# Patient Record
Sex: Female | Born: 1950 | Race: White | Hispanic: No | Marital: Married | Smoking: Former smoker
Health system: Southern US, Community
[De-identification: ages and names within clinical notes are randomized; demographics above are authoritative.]

## PROBLEM LIST (undated history)

## (undated) DIAGNOSIS — R5383 Other fatigue: Secondary | ICD-10-CM

## (undated) DIAGNOSIS — E039 Hypothyroidism, unspecified: Secondary | ICD-10-CM

## (undated) DIAGNOSIS — M545 Low back pain, unspecified: Secondary | ICD-10-CM

## (undated) DIAGNOSIS — F32A Depression, unspecified: Secondary | ICD-10-CM

## (undated) DIAGNOSIS — M533 Sacrococcygeal disorders, not elsewhere classified: Secondary | ICD-10-CM

## (undated) DIAGNOSIS — F329 Major depressive disorder, single episode, unspecified: Secondary | ICD-10-CM

## (undated) HISTORY — DX: Low back pain, unspecified: M54.50

## (undated) HISTORY — PX: TONSILLECTOMY: SUR1361

## (undated) HISTORY — PX: WRIST SURGERY: SHX841

## (undated) HISTORY — DX: Sacrococcygeal disorders, not elsewhere classified: M53.3

## (undated) HISTORY — DX: Other fatigue: R53.83

## (undated) HISTORY — PX: COLON RESECTION SIGMOID: SHX6737

## (undated) HISTORY — DX: Major depressive disorder, single episode, unspecified: F32.9

## (undated) HISTORY — PX: RADICAL HYSTERECTOMY: SHX2283

## (undated) HISTORY — DX: Low back pain: M54.5

## (undated) HISTORY — PX: ABDOMINAL WALL MESH  REMOVAL: SHX1116

## (undated) HISTORY — DX: Depression, unspecified: F32.A

## (undated) HISTORY — PX: CHOLECYSTECTOMY: SHX55

## (undated) HISTORY — DX: Hypothyroidism, unspecified: E03.9

---

## 2011-04-26 DIAGNOSIS — R32 Unspecified urinary incontinence: Secondary | ICD-10-CM | POA: Insufficient documentation

## 2011-05-27 DIAGNOSIS — E039 Hypothyroidism, unspecified: Secondary | ICD-10-CM | POA: Insufficient documentation

## 2012-03-14 ENCOUNTER — Encounter (INDEPENDENT_AMBULATORY_CARE_PROVIDER_SITE_OTHER): Payer: Self-pay | Admitting: General Surgery

## 2012-04-27 ENCOUNTER — Ambulatory Visit (INDEPENDENT_AMBULATORY_CARE_PROVIDER_SITE_OTHER): Payer: BC Managed Care – PPO | Admitting: Surgery

## 2012-05-23 DIAGNOSIS — M79673 Pain in unspecified foot: Secondary | ICD-10-CM | POA: Insufficient documentation

## 2012-05-23 DIAGNOSIS — M1710 Unilateral primary osteoarthritis, unspecified knee: Secondary | ICD-10-CM | POA: Insufficient documentation

## 2012-05-23 DIAGNOSIS — M7580 Other shoulder lesions, unspecified shoulder: Secondary | ICD-10-CM | POA: Insufficient documentation

## 2013-05-14 DIAGNOSIS — I10 Essential (primary) hypertension: Secondary | ICD-10-CM | POA: Insufficient documentation

## 2015-02-13 DIAGNOSIS — H02831 Dermatochalasis of right upper eyelid: Secondary | ICD-10-CM | POA: Insufficient documentation

## 2015-02-13 DIAGNOSIS — H02834 Dermatochalasis of left upper eyelid: Secondary | ICD-10-CM

## 2016-09-30 DIAGNOSIS — M23203 Derangement of unspecified medial meniscus due to old tear or injury, right knee: Secondary | ICD-10-CM | POA: Insufficient documentation

## 2017-08-23 ENCOUNTER — Ambulatory Visit: Payer: Self-pay | Admitting: Allergy and Immunology

## 2017-09-06 ENCOUNTER — Encounter: Payer: Self-pay | Admitting: Pediatrics

## 2017-09-06 ENCOUNTER — Ambulatory Visit: Payer: Medicare HMO | Admitting: Pediatrics

## 2017-09-06 VITALS — BP 148/76 | HR 70 | Temp 97.8°F | Resp 16 | Ht 63.2 in | Wt 181.2 lb

## 2017-09-06 DIAGNOSIS — J45909 Unspecified asthma, uncomplicated: Secondary | ICD-10-CM | POA: Insufficient documentation

## 2017-09-06 DIAGNOSIS — J3089 Other allergic rhinitis: Secondary | ICD-10-CM

## 2017-09-06 DIAGNOSIS — J4530 Mild persistent asthma, uncomplicated: Secondary | ICD-10-CM

## 2017-09-06 DIAGNOSIS — K219 Gastro-esophageal reflux disease without esophagitis: Secondary | ICD-10-CM

## 2017-09-06 MED ORDER — LEVALBUTEROL TARTRATE 45 MCG/ACT IN AERO: INHALATION_SPRAY | RESPIRATORY_TRACT | 1 refills | Status: AC

## 2017-09-06 MED ORDER — FLUTICASONE PROPIONATE 50 MCG/ACT NA SUSP: NASAL | 5 refills | Status: AC

## 2017-09-06 MED ORDER — METHYLPREDNISOLONE ACETATE 40 MG/ML IJ SUSP
40.0000 mg | Freq: Once | INTRAMUSCULAR | Status: AC
  Administered 2017-09-06: 40 mg via INTRAMUSCULAR

## 2017-09-06 MED ORDER — OMEPRAZOLE 20 MG PO CPDR: DELAYED_RELEASE_CAPSULE | ORAL | 5 refills | Status: DC

## 2017-09-06 MED ORDER — MONTELUKAST SODIUM 10 MG PO TABS: ORAL_TABLET | ORAL | 5 refills | Status: DC

## 2017-09-06 NOTE — Progress Notes (Signed)
[address] [place] [address] Dept: [phone]  New Patient Note  Patient ID: Zoe Willis, female    DOB: [birth year]  Age: 67 y.o. MRN: [id number] Date of Office Visit: 09/06/2017 Referring provider: Charmayne Sheer, MD [address] [place], [place] [address]    Chief Complaint: Nasal Congestion (sinus drainage since 04/2017.) and Cough (propane leak in home.  caused cough and alot of nasal sx.)  HPI Zoe Willis presents for evaluation of sinus problems since November 2018. She has aggravation of her nasal symptoms on exposure to dust. During this time she has had shortness of breath which improved with an albuterol inhaler. However if she  used the inhaler , she would developed a headache and heartburn.. She improved with an injection of a steroid. She cannot take prednisone tablets because of damage to her stomach and intestines when a mesh to repair a  hernia perforated her abdominal wall In November . 2018 she had an exposure to a central gas leak. They then removed central gas heating and replaced  the unit with a heat pump. A heat pump was not installed properly and they have  had a great deal of dust into the house.Further repairs are expected. She is having heartburn. She does not have a history of asthma in the past. She has never had eczema  Review of Systems  Constitutional: Negative.   HENT:       Sinus problems since November 2018 aggravated by exposure to dust Tonsillectomy  Eyes: Negative.   Respiratory:       Wheezing and shortness of breath since November 2018. Relieved with an albuterol inhaler  Cardiovascular:       Slight elevation of her blood pressure since November 2018  Gastrointestinal:       Acid reflux since November 2018. Cholecystectomy, appendectomy and a hernia mesh which had to be removed.  Genitourinary:       Hysterectomy  Musculoskeletal: Negative.   Skin: Negative.   Neurological:       History of migraine headaches    Endo/Heme/Allergies:       No diabetes. Low thyroid function  Psychiatric/Behavioral: Negative.     Outpatient Encounter Medications as of 09/06/2017  Medication Sig  . acetaminophen (TYLENOL) 500 MG tablet Take 500 mg by mouth every 6 (six) hours as needed for mild pain or headache.  . fluticasone (FLONASE) 50 MCG/ACT nasal spray One spray each nostril twice a day for nasal congestion or drainage.  Marland Kitchen levothyroxine (SYNTHROID, LEVOTHROID) 88 MCG tablet Take 88 mcg by mouth daily.  Marland Kitchen LORazepam (ATIVAN) 0.5 MG tablet Take 0.5 mg by mouth every 8 (eight) hours.  . Olopatadine HCl 0.2 % SOLN Place one drop into both eyes daily.  Marland Kitchen omeprazole (PRILOSEC) 20 MG capsule One capsule once a day for reflux  . [DISCONTINUED] fluticasone (FLONASE) 50 MCG/ACT nasal spray SPRAY 1 SPRAY INTO EACH NOSTRIL EVERY DAY  . [DISCONTINUED] omeprazole (PRILOSEC) 20 MG capsule Take 20 mg by mouth daily.  Marland Kitchen levalbuterol (XOPENEX HFA) 45 MCG/ACT inhaler 2 puffs with spacer every 6 hours if needed for cough or wheeze.  . montelukast (SINGULAIR) 10 MG tablet Take 1 tablet once a day for coughing or wheezing  . [DISCONTINUED] buPROPion (ZYBAN) 150 MG 12 hr tablet Take 150 mg by mouth 2 (two) times daily.  . [DISCONTINUED] cholecalciferol (VITAMIN D) 400 UNITS TABS Take by mouth.  . [DISCONTINUED] ciprofloxacin (CIPRO) 500 MG tablet Take 500 mg by mouth 2 (  two) times daily.  . [DISCONTINUED] estrogens, conjugated, (PREMARIN) 0.3 MG tablet Take 0.3 mg by mouth daily. Take daily for 21 days then do not take for 7 days.  . [DISCONTINUED] meloxicam (MOBIC) 7.5 MG tablet Take 7.5 mg by mouth daily.  . [DISCONTINUED] oxyCODONE-acetaminophen (PERCOCET/ROXICET) 5-325 MG per tablet Take 1 tablet by mouth every 4 (four) hours as needed.  . [DISCONTINUED] PREVNAR 13 SUSP injection TO BE ADMINISTERED BY PHARMACIST FOR IMMUNIZATION  . [DISCONTINUED] psyllium (METAMUCIL) 58.6 % powder Take 1 packet by mouth 3 (three) times daily.  .  [EXPIRED] methylPREDNISolone acetate (DEPO-MEDROL) injection 40 mg    No facility-administered encounter medications on file as of 09/06/2017.      Drug Allergies:  Allergies  Allergen Reactions  . Iodine Diarrhea, Itching and Hives    Tongue swells also Tongue swells also   . Doxycycline Swelling  . Shellfish Allergy Other (See Comments)  . Sucralfate Other (See Comments)    Unknown  . Kinevac [Sincalide] Diarrhea  . Polyethylene Glycol Diarrhea  . Sulfa Antibiotics Other (See Comments)    Blisters on tongue.  . Sulfasalazine Itching  . Bentyl  [Dicyclomine Hcl] Itching and Rash  . Cephalexin Itching, Nausea And Vomiting and Rash  . Dicyclomine Other (See Comments) and Rash    Blisters on tongue.   . Ibuprofen Rash  . Iodinated Diagnostic Agents Rash and Swelling  . Metronidazole Rash  . Nitrofurantoin Itching and Rash  . Other Itching and Rash  . Ventolin [Albuterol] Nausea Only, Anxiety and Other (See Comments)    Dry mouth and dizziness    Family History: Zoe Willis's family history includes Allergic rhinitis in her sister; Asthma in her sister.. Family history is negative for angioedema, eczema, hives, food allergies, chronic bronchitis or emphysema.  Social and environmental. They have 1 dog in the home. She is not exposed to cigarette smoking. She smoked cigarettes for 7 years and stopped in 1977. 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 In November 2018 they have a central gas heating leak. They then installed a heat pump. This is keeping pump was not installed properly and a family was exposed to a lot of dust. They still have a lot of dust coming inside the home, but it is better. She has a history of shellfish allergy  and does not eat shellfish.  Physical Exam: BP (!) 148/76 (BP Location: Left Arm, Patient Position: Sitting, Cuff Size: Normal)   Pulse 70   Temp 97.8 F (36.6 C) (Oral)   Resp 16   Ht 5' 3.2" (1.605 m)   Wt 181 lb 3.2 oz (82.2 kg)   SpO2 98%   BMI  31.90 kg/m    Physical Exam  Constitutional: She is oriented to person, place, and time. She appears well-developed and well-nourished.  HENT:  Eyes normal. Ears normal. Nose mild swelling of nasal turbinates with an excoriation noted in the left nostril. Pharynx normal.  Neck: Neck supple. No thyromegaly present.  Cardiovascular:  S1 and S2 normal no murmurs  Pulmonary/Chest:  Clear to percussion and auscultation  Abdominal: Soft. There is no tenderness (no hepatosplenomegaly).  Lymphadenopathy:    She has no cervical adenopathy.  Neurological: She is alert and oriented to person, place, and time.  Skin:  Clear  Psychiatric: She has a normal mood and affect. Her behavior is normal. Judgment and thought content normal.  Vitals reviewed.   Diagnostics: FVC 2.56 L FEV1 2.11 L. Predicted FVC 2.94 L predicted FEV1 2.23 L.  After albuterol 2 puffs FVC 2.49 L FEV1 2.14 L-the spirometry is in the normal range and there was no significant improvement after albuterol but her peak flow did improve 36%  Allergy skin tests were positive to a variety of  molds  on intradermal testing only   Assessment  Assessment and Plan: 1. Mild persistent reactive airway disease without complication   2. Other allergic rhinitis   3. Gastroesophageal reflux disease without esophagitis     Meds ordered this encounter  Medications  . methylPREDNISolone acetate (DEPO-MEDROL) injection 40 mg  . montelukast (SINGULAIR) 10 MG tablet    Sig: Take 1 tablet once a day for coughing or wheezing    Dispense:  30 tablet    Refill:  5  . levalbuterol (XOPENEX HFA) 45 MCG/ACT inhaler    Sig: 2 puffs with spacer every 6 hours if needed for cough or wheeze.    Dispense:  1 Inhaler    Refill:  1  . omeprazole (PRILOSEC) 20 MG capsule    Sig: One capsule once a day for reflux    Dispense:  30 capsule    Refill:  5  . fluticasone (FLONASE) 50 MCG/ACT nasal spray    Sig: One spray each nostril twice a day for nasal  congestion or drainage.    Dispense:  16 g    Refill:  5    Patient Instructions  Environmental control of dust and mold Zyrtec 10 mg-take 1 tablet once a day for runny nose Polysporin ointment twice a day in  the left nostril for 1 week Next week, start fluticasone 1 spray per nostril twice a day for stuffy nose Montelukast  10 mg-take 1 tablet once a day for coughing or wheezing Xopenex 2 puffs every 6 hours if needed for coughing or wheezing. Use a spacer Omeprazole 20 mg-take 1 capsule once a day for acid reflux Depo-Medrol 40 mg IM Call me if you're not doing better on this treatment plan A HEPA filter year in your  bedroom would help Continue on your other medications I gave you an injection of Depo-Medrol 40 mg IM today Continue avoiding shellfish Monitor your blood pressure and make sure that it falls   Return in about 4 weeks (around 10/04/2017).   Thank you for the opportunity to care for this patient.  Please do not hesitate to contact me with questions.  Tonette Bihari, M.D.  [facility] [address] [place], [place] [address] [phone]

## 2017-09-06 NOTE — Patient Instructions (Addendum)
Environmental control of dust and mold Zyrtec 10 mg-take 1 tablet once a day for runny nose Polysporin ointment twice a day in  the left nostril for 1 week Next week, start fluticasone 1 spray per nostril twice a day for stuffy nose Montelukast  10 mg-take 1 tablet once a day for coughing or wheezing Xopenex 2 puffs every 6 hours if needed for coughing or wheezing. Use a spacer Omeprazole 20 mg-take 1 capsule once a day for acid reflux Depo-Medrol 40 mg IM Call me if you're not doing better on this treatment plan A HEPA filter year in your  bedroom would help Continue on your other medications I gave you an injection of Depo-Medrol 40 mg IM today Continue avoiding shellfish Monitor your blood pressure and make sure that it falls

## 2017-09-07 ENCOUNTER — Telehealth: Payer: Self-pay | Admitting: Allergy

## 2017-09-07 NOTE — Telephone Encounter (Signed)
I talked to the patient. I feel that the heartburn and feeling jittery  was probably related to a Depo-Medrol 40 mg injection that she received yesterday. She has not started the omeprazole. She will take omeprazole 20 mg twice a day for 2 or 3 days and then if the  Heartburn is  better , she could use omeprazole 20 mg once a day . She may take Benadryl 25 mg every 4 hours if needed. She will call me if she is not doing well on this treatment plan

## 2017-09-07 NOTE — Telephone Encounter (Signed)
Patient called and said after she left office yesterday she had a headache, sick on stomach skin burning   On the out side.went to bed with it Said she didn't sleep but about 2 hours last night. Was sick on stomach the morning ate a little and mad it worse. Stomach feels like it is inflamed took Omeprazole this morning.Said she had a couple of loose stools this morning,tongue burning and white spots on it.Patient  said she had no fever.Phone [phone]

## 2017-10-04 ENCOUNTER — Ambulatory Visit: Payer: Medicare HMO | Admitting: Pediatrics

## 2017-11-10 ENCOUNTER — Ambulatory Visit (INDEPENDENT_AMBULATORY_CARE_PROVIDER_SITE_OTHER)
Admission: RE | Admit: 2017-11-10 | Discharge: 2017-11-10 | Disposition: A | Discharge status: Home / Self Care | Payer: Medicare HMO | Source: Ambulatory Visit | Attending: Internal Medicine | Admitting: Internal Medicine

## 2017-11-10 ENCOUNTER — Encounter: Payer: Self-pay | Admitting: Internal Medicine

## 2017-11-10 ENCOUNTER — Ambulatory Visit: Payer: Medicare HMO | Admitting: Internal Medicine

## 2017-11-10 VITALS — BP 140/80 | HR 71 | Ht 63.0 in | Wt 169.8 lb

## 2017-11-10 DIAGNOSIS — Z87898 Personal history of other specified conditions: Secondary | ICD-10-CM | POA: Diagnosis not present

## 2017-11-10 DIAGNOSIS — R05 Cough: Secondary | ICD-10-CM

## 2017-11-10 DIAGNOSIS — J387 Other diseases of larynx: Secondary | ICD-10-CM

## 2017-11-10 DIAGNOSIS — R053 Chronic cough: Secondary | ICD-10-CM

## 2017-11-10 DIAGNOSIS — Z8719 Personal history of other diseases of the digestive system: Secondary | ICD-10-CM | POA: Diagnosis not present

## 2017-11-10 LAB — POCT EXHALED NITRIC OXIDE

## 2017-11-10 NOTE — Progress Notes (Signed)
Subjective:    Patient ID: Zoe Willis, female    DOB: 1950-10-25, 67 y.o.   MRN: [id number] PCP Charmayne Sheer, MD Allergist - Dr Stephannie Li GI - Dr Herbert Deaner  HPI  IOV 11/10/2017  Chief Complaint  Patient presents with  . Consult    Referred by Concepcion Elk due to mold exposure. Pt had a new furnace placed in house 04/2017 and symptoms began 08/2017. Pt states for about two months beginning March 2019, she was having burning in lungs, wheezing, and SOB.    Zoe Mussel. Shiflett -is referred for cough and chest symptoms.  Previously healthy female.  History is obtained from her and review of the outside charts from allergist and GI doctor mentioned above.  And also the primary care notes from October 11, 2017.  A lot of data points and therefore history might not be totally accurate in my documentation.  But as best as I can gather  It appears in October 2018 the gas maintenance person came for a routine service and apparently left the place with the switch turned on allowing for a gas leak in the house.  The first few days of this patient had traveled out of town with her husband and so she did not realize this but when she returned within a few hours she developed headache GI distress and also cough with chest tightness associated with red eyes and hoarseness and bronchitis.  Was a total of a week since the service before patient and her husband discovered the problem.  She room was being treated with steroids for the same and after the problem was addressed her symptoms subsequently resolved.  Then in November 2018 prior to Thanksgiving she had a furnace system serviced/changed out.  This time the service person did not put a filter in and for the next few months the furnace was sucking in all the home room dust and blowing it out.  This was not realized until a while later or a few months later.  In January 2019 she did see allergist Dr. Felipe Drone whose note from March I could review.   I visualized a normal spirometry tracing image.  His notes indicate positivity for mold on skin testing.  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  She tells me she has not responded to inhalers.  She has been on Singulair since approximately January 2019 according to her history.  This is not helped.  She she has continued to have epigastric pain along with cough and chest tightness.  Things have not improved.  Sometime late winter early spring she did get the filter issues sorted out but the problem still continue.  Subsequently the service people also put some chemical to repair her furnace unit and this can continue to aggravate her respiratory symptoms.  She saw the GI physician at [facility] on September 26, 2017 and I reviewed his notes where she is reported some dysphagia and diarrhea history of antibiotics and prednisone and PPI intake all without any relief  She has a previous history in 2015 of gastritis and ulcer disease with H. pylori negative.  She is partial colectomy for diverticulitis in the past.  Status post numerous polypectomies.  History of delayed gastric emptying.  History of negative celiac testing.   Dr Gretta Cool  Reflux Symptom Index (> 13-15 suggestive of LPR cough) 11/10/2017   Hoarseness of problem with voice 5  Clearing  Of Throat 5  Excess throat mucus or feeling of post nasal drip 3  Difficulty swallowing food, liquid or tablets 5  Cough after eating or lying down 3  Breathing difficulties or choking episodes 3  Troublesome or annoying cough 3  Sensation of something sticking in throat or lump in throat 5  Heartburn, chest pain, indigestion, or stomach acid coming up 5  TOTAL 37      feno 5 ppb and normal 11/10/2017   spiirometry outside record visualized graph myself - looks normal 09/06/17    has a past medical  history of Coccyxdynia, Depression, Fatigue, Hypothyroid, and Low back pain.   reports that she quit smoking about 42 years ago. Her smoking use included cigarettes. She has a 10.50 pack-year smoking history. She has never used smokeless tobacco.  Past Surgical History:  Procedure Laterality Date  . ABDOMINAL WALL MESH  REMOVAL    . CHOLECYSTECTOMY    . COLON RESECTION SIGMOID    . RADICAL HYSTERECTOMY    . TONSILLECTOMY    . WRIST SURGERY      Allergies  Allergen Reactions  . Iodine Diarrhea, Itching and Hives    Tongue swells also Tongue swells also   . Doxycycline Swelling  . Shellfish Allergy Other (See Comments)  . Sucralfate Other (See Comments)    Unknown  . Kinevac [Sincalide] Diarrhea  . Polyethylene Glycol Diarrhea  . Sulfa Antibiotics Other (See Comments)    Blisters on tongue.  . Sulfasalazine Itching  . Bentyl  [Dicyclomine Hcl] Itching and Rash  . Cephalexin Itching, Nausea And Vomiting and Rash  . Dicyclomine Other (See Comments) and Rash    Blisters on tongue.   . Ibuprofen Rash  . Iodinated Diagnostic Agents Rash and Swelling  . Metronidazole Rash  . Nitrofurantoin Itching and Rash  . Other Itching and Rash  . Ventolin [Albuterol] Nausea Only, Anxiety and Other (See Comments)    Dry mouth and dizziness    Immunization History  Administered Date(s) Administered  . Influenza, High Dose Seasonal PF 05/11/2016, 04/14/2017  . Pneumococcal Conjugate-13 06/24/2017  . Pneumococcal Polysaccharide-23 05/26/2016  . Tdap 11/20/2013    Family History  Problem Relation Age of Onset  . Allergic rhinitis Sister   . Asthma Sister   . Angioedema Neg Hx   . Eczema Neg Hx   . Immunodeficiency Neg Hx   . Urticaria Neg Hx      Current Outpatient Medications:  .  acetaminophen (TYLENOL) 500 MG tablet, Take 500 mg by mouth every 6 (six) hours as needed for mild pain or headache., Disp: , Rfl:  .  fluticasone (FLONASE) 50 MCG/ACT nasal spray, One spray each  nostril twice a day for nasal congestion or drainage., Disp: 16 g, Rfl: 5 .  levalbuterol (XOPENEX HFA) 45 MCG/ACT inhaler, 2 puffs with spacer every 6 hours if needed for cough or wheeze., Disp: 1 Inhaler, Rfl: 1 .  levothyroxine (SYNTHROID, LEVOTHROID) 88 MCG tablet, Take 88 mcg by mouth daily., Disp: , Rfl:  .  LORazepam (ATIVAN) 0.5 MG tablet, Take 0.5 mg by mouth every 8 (eight) hours., Disp: , Rfl:  .  Olopatadine HCl 0.2 % SOLN, Place one drop into both eyes daily., Disp: , Rfl:  .  montelukast (SINGULAIR) 10 MG tablet, Take 1 tablet once a day for coughing or wheezing (Patient not taking: Reported on  11/10/2017), Disp: 30 tablet, Rfl: 5       Review of Systems  Constitutional: Positive for unexpected weight change. Negative for fever.  HENT: Positive for congestion, sore throat and trouble swallowing. Negative for dental problem, ear pain, nosebleeds, postnasal drip, rhinorrhea, sinus pressure and sneezing.   Eyes: Positive for redness and itching.  Respiratory: Positive for chest tightness, shortness of breath and wheezing. Negative for cough.   Cardiovascular: Positive for palpitations. Negative for leg swelling.  Gastrointestinal: Positive for nausea. Negative for vomiting.  Genitourinary: Negative for dysuria.  Musculoskeletal: Negative for joint swelling.  Skin: Negative for rash.  Allergic/Immunologic: Positive for environmental allergies. Negative for food allergies and immunocompromised state.  Neurological: Positive for headaches.  Hematological: Bruises/bleeds easily.  Psychiatric/Behavioral: Negative for dysphoric mood. The patient is not nervous/anxious.        Objective:   Physical Exam  Constitutional: She is oriented to person, place, and time. She appears well-developed and well-nourished. No distress.  HENT:  Head: Normocephalic and atraumatic.  Right Ear: External ear normal.  Left Ear: External ear normal.  Mouth/Throat: Oropharynx is clear and moist. No  oropharyngeal exudate.  Occasional Intermittent laryngeal cough   Eyes: Pupils are equal, round, and reactive to light. Conjunctivae and EOM are normal. Right eye exhibits no discharge. Left eye exhibits no discharge. No scleral icterus.  Neck: Normal range of motion. Neck supple. No JVD present. No tracheal deviation present. No thyromegaly present.  Cardiovascular: Normal rate, regular rhythm, normal heart sounds and intact distal pulses. Exam reveals no gallop and no friction rub.  No murmur heard. Pulmonary/Chest: Effort normal and breath sounds normal. No respiratory distress. She has no wheezes. She has no rales. She exhibits no tenderness.  Abdominal: Soft. Bowel sounds are normal. She exhibits no distension and no mass. There is no tenderness. There is no rebound and no guarding.  Musculoskeletal: Normal range of motion. She exhibits no edema or tenderness.  Lymphadenopathy:    She has no cervical adenopathy.  Neurological: She is alert and oriented to person, place, and time. She has normal reflexes. No cranial nerve deficit. She exhibits normal muscle tone. Coordination normal.  Skin: Skin is warm and dry. No rash noted. She is not diaphoretic. No erythema. No pallor.  Psychiatric: She has a normal mood and affect. Her behavior is normal. Judgment and thought content normal.  Vitals reviewed.   Vitals:   11/10/17 1112  BP: 140/80  Pulse: 71  SpO2: 100%  Weight: 169 lb 12.8 oz (77 kg)  Height:  (1.6 m)    Estimated body mass index is 30.08 kg/m as calculated from the following:   Height as of this encounter:  (1.6 m).   Weight as of this encounter: 169 lb 12.8 oz (77 kg).       Assessment & Plan:     ICD-10-CM   1. Chronic cough R05 DG Chest 2 View    Pulmonary function test  2. Irritable larynx J38.7   3. Hx of dust exposure Z87.898   4. H/O chemical exposure Z87.898   5. 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 Kalman Shan, M.D., [facility].C.P Pulmonary and Critical Care Medicine Staff Physician, [facility] Center Director - Interstitial Lung Disease  Program  Pulmonary Fibrosis [facility] at [facility] [place], [place], [address]  Pager: [phone], If no answer or between  15:00h - 7:00h: call [phone] Telephone: [phone]

## 2017-11-10 NOTE — Patient Instructions (Addendum)
ICD-10-CM   1. Chronic cough R05   2. Irritable larynx J38.7   3. Hx of dust exposure Z87.898   4. H/O chemical exposure Z87.898   5. History of gastroesophageal reflux (GERD) Z87.19     No current evidence of asthma Suspect exposures and acid reflux are causing airway irritability that might be nerve based   Plan Do CXR 2 view 11/10/2017 Do full PFT next few weeks Sign and get records from Dr Loney Loh allergist and GI doc at [facility]  - ask GI doc if you should be on acid reflux treatment  - continue singulair  Followup Next few to several weeks with Dr Marchelle Gearing or an app  - if no cause found, will consider gabapentin to calm the airway irritability

## 2017-11-14 ENCOUNTER — Telehealth: Payer: Self-pay

## 2017-11-14 NOTE — Telephone Encounter (Signed)
Let Zoe Willis. Ashmore know  1. Thanks for info 2. After she left I was able to look at their notes trhough care everywhere and got info needed; so we are good 3. December 23, 2017 for followup with PFT is too far out - so see if she can come earlier for PFT and see an APP - Tammy  - can be started on gabaentin of PFT ok

## 2017-11-14 NOTE — Telephone Encounter (Signed)
Called pt and advised message from the provider. Pt understood and verbalized understanding. Nothing further is needed.   Appt made with TP

## 2017-11-14 NOTE — Telephone Encounter (Signed)
LMTCB x1 for pt.  

## 2017-11-14 NOTE — Telephone Encounter (Signed)
Dr Marchelle Gearing, I gave pt her results and she wanted to let you know the allergist and GI doctor names. FYI  Dr Erlinda Hong [phone] [facility]) Dr. Martie Round [phone]

## 2017-11-14 NOTE — Telephone Encounter (Signed)
Patient returned call, CB is [phone].

## 2017-11-25 ENCOUNTER — Ambulatory Visit (INDEPENDENT_AMBULATORY_CARE_PROVIDER_SITE_OTHER): Payer: Medicare HMO | Admitting: Internal Medicine

## 2017-11-25 ENCOUNTER — Encounter: Payer: Self-pay | Admitting: Pulmonary Disease

## 2017-11-25 ENCOUNTER — Ambulatory Visit: Payer: Medicare HMO | Admitting: Pulmonary Disease

## 2017-11-25 DIAGNOSIS — J4530 Mild persistent asthma, uncomplicated: Secondary | ICD-10-CM | POA: Diagnosis not present

## 2017-11-25 DIAGNOSIS — R05 Cough: Secondary | ICD-10-CM | POA: Diagnosis not present

## 2017-11-25 DIAGNOSIS — R053 Chronic cough: Secondary | ICD-10-CM

## 2017-11-25 DIAGNOSIS — R059 Cough, unspecified: Secondary | ICD-10-CM | POA: Insufficient documentation

## 2017-11-25 LAB — PULMONARY FUNCTION TEST
DL/VA % PRED: 106 %
DL/VA: 5 ml/min/mmHg/L
DLCO unc % pred: 92 %
DLCO unc: 21.16 ml/min/mmHg
FEF 25-75 POST: 3.34 L/s
FEF 25-75 Pre: 2.45 L/sec
FEF2575-%Change-Post: 36 %
FEF2575-%PRED-POST: 176 %
FEF2575-%Pred-Pre: 129 %
FEV1-%CHANGE-POST: 8 %
FEV1-%PRED-PRE: 98 %
FEV1-%Pred-Post: 106 %
FEV1-POST: 2.36 L
FEV1-Pre: 2.17 L
FEV1FVC-%Change-Post: 5 %
FEV1FVC-%PRED-PRE: 109 %
FEV6-%Change-Post: 2 %
FEV6-%PRED-POST: 95 %
FEV6-%Pred-Pre: 92 %
FEV6-PRE: 2.59 L
FEV6-Post: 2.66 L
FEV6FVC-%Change-Post: 0 %
FEV6FVC-%PRED-PRE: 103 %
FEV6FVC-%Pred-Post: 104 %
FVC-%CHANGE-POST: 2 %
FVC-%PRED-PRE: 89 %
FVC-%Pred-Post: 91 %
FVC-POST: 2.67 L
FVC-PRE: 2.6 L
POST FEV1/FVC RATIO: 88 %
PRE FEV6/FVC RATIO: 100 %
Post FEV6/FVC ratio: 100 %
Pre FEV1/FVC ratio: 84 %
RV % PRED: 131 %
RV: 2.79 L
TLC % pred: 112 %
TLC: 5.49 L

## 2017-11-25 MED ORDER — BUDESONIDE-FORMOTEROL FUMARATE 80-4.5 MCG/ACT IN AERO: 2.0000 | INHALATION_SPRAY | Freq: Two times a day (BID) | RESPIRATORY_TRACT | 0 refills | Status: AC

## 2017-11-25 NOTE — Progress Notes (Signed)
 @Patient  ID: Zoe Willis, female    DOB: [birth year], 67 y.o.   MRN: [id number]  Chief Complaint  Patient presents with  . Follow-up    FU PFT     Referring provider: Charmayne Sheeravis, Ann H, MD  HPI: 67 year old female patient referred to office on 11/10/2017-for cough and chest symptoms. PMH:  has a past medical history of Coccyxdynia, Depression, Fatigue, Hypothyroid, and Low back pain, 2015 of gastritis and ulcer disease with H. pylori negative.  She is partial colectomy for diverticulitis in the past.  Status post numerous polypectomies.     PCP Charmayne Sheeravis, Ann H, MD Allergist - Dr Stephannie LiJose Bardelas GI - Dr Herbert DeanerMurat Akdamar  Former smoker-quit 42 years ago- 10.5-pack-year smoking history.  Recent [facility] Encounters:   11/10/17-initial-Ramaswamy Referred initially due to potential mold exposure.  Patient had a new furnace placed in house 04/2017 and symptoms began 08/2017.  Patient states for about 2 months beginning March 2019 she is having burning in her lungs, wheezing, shortness of breath.  October 2018 gas maintenance person came for routine service and left gas switch on allowing for a gas leak to occur in the house.  Gas leak occurred for a total of 1 week before patient's husband discovered the problem. Patient also reports exposures to potential outdoor and indoor mold.  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  She has not responded to inhalers.  Patient has been on Singulair since approximately January 18.  Has not helped. 11/10/17-DrCarlos American. [place] reflux symptom index-37   Plan: Do CXR 2 view 11/10/2017 Do full PFT next few weeks in particular to look for evidence of asthma Sign and get records [facility]Bartolis [facility]  - ask GI doc if you should be on acid reflux treatment  - continue singulair    Tests:  11/10/2017-Fino-5 09/06/2017-spirometry from outside record visualized appears normal  Imaging:  11/10/2017-chest x-ray-  no active cardiopulmonary disease, degenerative changes in mid to lower thoracic spine, surgical clips are present in the right upper quadrant from prior cholecystectomy   Chart Review:    11/25/17 OV  Patient presents today with continued symptoms.  Patient says that her symptoms are present only when in her home or sleeping in her room.  When staying at family's homes the symptoms do not occur.  Symptoms are cough, with productive gray-yellow mucus, brownish buildup on tongue, nausea, and headaches.  These symptoms are present only when staying within her home.  Her husband has similar symptoms.  Review of chart shows October 2018 gas leak.  As well as continued issues with new furnace installed.  Which is been pushing dust, fine particles, and potentially insulation into the air in her home.  Presenting post pulmonary function test performed today.   Allergies  Allergen Reactions  . Iodine Diarrhea, Itching and Hives    Tongue swells also Tongue swells also   . Doxycycline Swelling  . Shellfish Allergy Other (See Comments)  . Sucralfate Other (See Comments)    Unknown  . Kinevac [Sincalide] Diarrhea  . Polyethylene Glycol Diarrhea  . Sulfa Antibiotics Other (See Comments)    Blisters on tongue.  . Sulfasalazine Itching  . Bentyl  [Dicyclomine Hcl] Itching and Rash  . Cephalexin Itching, Nausea And Vomiting and Rash  . Dicyclomine Other (See Comments) and Rash    Blisters on tongue.   . Ibuprofen Rash  . Iodinated Diagnostic Agents Rash and Swelling  .  Metronidazole Rash  . Nitrofurantoin Itching and Rash  . Other Itching and Rash  . Ventolin [Albuterol] Nausea Only, Anxiety and Other (See Comments)    Dry mouth and dizziness    Immunization History  Administered Date(s) Administered  . Influenza, High Dose Seasonal PF 05/11/2016, 04/14/2017  . Pneumococcal Conjugate-13 06/24/2017  . Pneumococcal Polysaccharide-23 05/26/2016  . Tdap 11/20/2013    Past Medical  History:  Diagnosis Date  . Coccyxdynia   . Depression   . Fatigue   . Hypothyroid   . Low back pain     Tobacco History: Social History   Tobacco Use  Smoking Status Former Smoker  . Packs/day: 1.50  . Years: 7.00  . Pack years: 10.50  . Types: Cigarettes  . Last attempt to quit: 11/27/1974  . Years since quitting: 43.0  Smokeless Tobacco Never Used   Counseling given: Yes   Outpatient Encounter Medications as of 11/25/2017  Medication Sig  . acetaminophen (TYLENOL) 500 MG tablet Take 500 mg by mouth every 6 (six) hours as needed for mild pain or headache.  . fluticasone (FLONASE) 50 MCG/ACT nasal spray One spray each nostril twice a day for nasal congestion or drainage.  . levalbuterol (XOPENEX HFA) 45 MCG/ACT inhaler 2 puffs with spacer every 6 hours if needed for cough or wheeze.  Marland Kitchen levothyroxine (SYNTHROID, LEVOTHROID) 88 MCG tablet Take 88 mcg by mouth daily.  Marland Kitchen LORazepam (ATIVAN) 0.5 MG tablet Take 0.5 mg by mouth every 8 (eight) hours.  . Olopatadine HCl 0.2 % SOLN Place one drop into both eyes daily.  . budesonide-formoterol (SYMBICORT) 80-4.5 MCG/ACT inhaler Inhale 2 puffs into the lungs 2 (two) times daily.  . montelukast (SINGULAIR) 10 MG tablet Take 1 tablet once a day for coughing or wheezing (Patient not taking: Reported on 11/25/2017)   No facility-administered encounter medications on file as of 11/25/2017.      Review of Systems  Constitutional:  +continued weight loss No night sweats,  fevers, chills, fatigue, or  lassitude HEENT:  +HA, cough, sneezing, post nasal drip, nasal congestion No Difficulty swallowing,  Tooth/dental problems, or  Sore throat, No sneezing, itching, ear ache CV: No chest pain,  orthopnea, PND, swelling in lower extremities, anasarca, dizziness, palpitations, syncope  GI: +nausea No heartburn, indigestion, abdominal pain,vomiting, diarrhea, change in bowel habits, loss of appetite, bloody stools Resp: +sob when inside her home, cough  with yellow / grey mucous No coughing up of blood. No wheezing.  No chest wall deformity Skin: no rash, lesions, no skin changes. GU: no dysuria, change in color of urine, no urgency or frequency.  No flank pain, no hematuria  MS:  No joint pain or swelling.  No decreased range of motion.  No back pain. Psych:  No change in mood or affect. No depression or anxiety.  No memory loss.   Physical Exam  BP 134/78 (BP Location: Left Arm, Cuff Size: Normal)   Pulse 87   Ht 5\' 3"  (1.6 m)   Wt 170 lb (77.1 kg)   SpO2 94%   BMI 30.11 kg/m    Wt Readings from Last 3 Encounters:  11/25/17 170 lb (77.1 kg)  11/10/17 169 lb 12.8 oz (77 kg)  09/06/17 181 lb 3.2 oz (82.2 kg)     GEN: A/Ox3; pleasant , NAD, well nourished    HEENT:  [place]/AT,  EACs-clear, TMs-wnl, NOSE-clear, THROAT- +post nasal drip, no lesions  NECK:  Supple w/ fair ROM; no JVD;  no thyromegaly or  nodules palpated; no lymphadenopathy.    RESP:  Clear  P & A; w/o, wheezes/ rales/ or rhonchi. no accessory muscle use, no dullness to percussion  CARD:  RRR, no m/r/g, no peripheral edema, pulses intact, no cyanosis or clubbing.  GI:   Soft & nt; nml bowel sounds; no organomegaly or masses detected.   Musco: Warm bil, no deformities or joint swelling noted.   Neuro: alert, no focal deficits noted.    Skin: Warm, no lesions or rashes    Lab Results:  CBC No results found for: WBC, RBC, HGB, HCT, PLT, MCV, MCH, MCHC, RDW, LYMPHSABS, MONOABS, EOSABS, BASOSABS  BMET No results found for: NA, K, CL, CO2, GLUCOSE, BUN, CREATININE, CALCIUM, GFRNONAA, GFRAA  BNP No results found for: BNP  ProBNP No results found for: PROBNP  Imaging: Dg Chest 2 View  Result Date: 11/10/2017 CLINICAL DATA:  Cough, shortness of breath for several months, exposure to gas fumes and dust, former smoking history EXAM: CHEST - 2 VIEW COMPARISON:  None. FINDINGS: No active infiltrate or effusion is seen. Mediastinal and hilar contours are  unremarkable. The heart is within normal limits in size. There are degenerative changes in the mid to lower thoracic spine. Surgical clips are present in the right upper quadrant from prior cholecystectomy. IMPRESSION: No active cardiopulmonary disease. Electronically Signed   By: Dwyane Dee M.D.   On: 11/10/2017 16:53     Assessment & Plan:   Reviewed pulmonary function test with patient today.  Seeing mid flow reversibility on exam.  No obstruction seen.  Will start Symbicort 80 after discussing with Dr. Marchelle Gearing.  Patient to call and notify us if she is feeling better and improved symptoms with this inhaler treatment.  Patient also to continue using her rescue inhaler.  Patient to try her best to avoid being in her home for extended periods of time, and sleeping in her home.  When she is in her home if she can wear a mask when able to.  Encouraged her test for her husband to the same.  Will order a high-res CT today to be completed over the following weeks.  Due to continued symptoms, fine particles in home care, as well as potential exposure to insulation.  Discussed the potential starting gabapentin to help manage chronic cough.  Patient would like to hold off on this intervention as symptoms are have improved when she is away from the trigger of being on her own.  Patient knows this is an option and will call our office if she is interested in starting it.  Reactive airway disease Start Symbicort 80 puffs twice a day >>> Sample provided today in office >>> Patient to call if symptoms improve with this treatment so we can place an order  Try to wear a mask when able and in your home >>> Mask provided from office today  Avoid sleeping in your home when able   High-res CT ordered  Follow-up in 2 weeks    Cough Start Symbicort 80 puffs twice a day >>> Sample provided today in office >>> Patient to call if symptoms improve with this treatment so we can place an order  Try to  wear a mask when able and in your home >>> Mask provided from office today  Avoid sleeping in your home when able   High-res CT ordered  Follow-up in 2 weeks       Coral Ceo, NP 11/25/2017

## 2017-11-25 NOTE — Assessment & Plan Note (Signed)
Start Symbicort 80 puffs twice a day >>> Sample provided today in office >>> Patient to call if symptoms improve with this treatment so we can place an order  Try to wear a mask when able and in your home >>> Mask provided from office today  Avoid sleeping in your home when able   High-res CT ordered  Follow-up in 2 weeks

## 2017-11-25 NOTE — Progress Notes (Signed)
PFT done today. 

## 2017-11-25 NOTE — Patient Instructions (Addendum)
Start Symbicort 80 puffs twice a day >>> Sample provided today in office >>> Patient to call if symptoms improve with this treatment so we can place an order  Try to wear a mask when able and in your home >>> Mask provided from office today  Avoid sleeping in your home when able   High-res CT ordered  Follow-up in 2 weeks     Please contact the office if your symptoms worsen or you have concerns that you are not improving.   Thank you for choosing [facility] for your healthcare, and for allowing [place] to partner with you on your healthcare journey. I am thankful to be able to provide care to you today.   Elisha Headland FNP-C

## 2017-12-08 NOTE — Progress Notes (Signed)
@Patient  ID: Zoe Willis, female    DOB: 1950/09/14, 67 y.o.   MRN: [id number]  Chief Complaint  Patient presents with  . Follow-up    ER Monday for diarrhea and burning in stomach, ,generalized weakness, headache and dizzines.    Referring provider: Elby Beck, MD  HPI: 67 year old female patient referred to office on 11/10/2017-for cough and chest symptoms. PMH:  has a past medical history of Coccyxdynia, Depression, Fatigue, Hypothyroid, and Low back pain, 2015 of gastritis and ulcer disease with H. pylori negative.  She is partial colectomy for diverticulitis in the past.  Status post numerous polypectomies.     PCP Elby Beck, MD Allergist - Dr Prince Solian GI - Dr Beau Fanny  Former smoker-quit 42 years ago- 10.5-pack-year smoking history.  Recent [facility] Encounters:   11/10/17-initial-Ramaswamy Referred initially due to potential mold exposure.  Patient had a new furnace placed in house 04/2017 and symptoms began 08/2017.  Patient states for about 2 months beginning March 2019 she is having burning in her lungs, wheezing, shortness of breath.  October 2018 gas maintenance person came for routine service and left gas switch on allowing for a gas leak to occur in the house.  Gas leak occurred for a total of 1 week before patient's husband discovered the problem. Patient also reports exposures to potential outdoor and indoor mold.  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  She has not responded to inhalers.  Patient has been on Singulair since approximately January 18.  Has not helped. 11/10/17-DrTerie Purser reflux symptom index-37   Plan: Do CXR 2 view 11/10/2017 Do full PFT next few weeks in particular to look for evidence of asthma Sign and get records from Dr Angelica Pou allergist and GI doc at [facility]  - ask GI doc if you should be on acid reflux treatment  - continue singulair  11/25/17 OV  Patient presents today with  continued symptoms.  Patient says that her symptoms are present only when in her home or sleeping in her room.  When staying at family's homes the symptoms do not occur. Symptoms are cough, with productive gray-yellow mucus, brownish buildup on tongue, nausea, and headaches.  These symptoms are present only when staying within her home.  Her husband has similar symptoms.  Review of chart shows October 2018 gas leak.  As well as continued issues with new furnace installed.  Which is been pushing dust, fine particles, and potentially insulation into the air in her home. Presenting post pulmonary function test performed today.  Plan: Symbicort 80, high-res CT, follow-up in 2 weeks  Tests:  11/10/2017-Fino-5 09/06/2017-spirometry from outside record visualized appears normal 11/25/2017-pulmonary function test- no obstruction seen, mid flow reversibility with bronchodilator  Imaging:  12/01/2017-high-res CT- no evidence of interstitial lung disease, there are a few punctate calcified granulomas 11/10/2017-chest x-ray- no active cardiopulmonary disease, degenerative changes in mid to lower thoracic spine, surgical clips are present in the right upper quadrant from prior cholecystectomy   Chart Review:  Records showing that patient did complete CT high resolution at [facility] facility.  Exam date was 12/01/2017.  CT high-res.  Findings are as follows: Lower neck: Unremarkable, heart and vessels: Normal heart size, no pericardial effusion, normal caliber thoracic aorta and main pulmonary artery, mild coronary artery calcification Mediastinum hila and axilla: No lymphadenopathy or masses Lungs: Center central airways are patent, bronchiectasis absent, honeycombing absent, reticular abnormality absent, groundglass opacity absent, mosaic attenuation Absent,  lobar predominance N/A, other findings: There are a few punctate calcified granulomas Pleura: No pleural effusion or pneumothorax, Upper abdomen: Status post  cholecystectomy Musculoskeletal: No acute or aggressive osseous abnormalities.  Mild degenerative disc disease in the thoracic spine. Impression: No evidence of interstitial lung disease.   12/08/17  OV / FU  67 year old patient presenting today for 2-week follow-up after completing high risk CT. Follow-up on Symbicort 80 which we trialed her on appointment.  Patient completed high risk CT at involved facility.  See impression above.  Does not appear to have evidence of interstitial lung disease.  Patient does not feel the Symbicort 80 has helped at all does not feel any improvement in her breathing.  Is not really bothered with her breathing at this time.  Patient would like to stop taking Symbicort.  Patient denies hemoptysis  Patient is more concerned with her recent nausea, diarrhea, loose stools which she has had for the last 3 weeks.  Patient says that symptoms worsen whenever she is at her house.  Patient presented to the emergency room and had a work-up for this without CT.  Patient says that they diagnosed her with GERD and switched her omeprazole to Protonix.  Patient said that the Protonix did not sit well with her.  She is hoping either present back to omeprazole or if there is something else she can be on.  Patient denies blood in her stools.  Unfortunately, patient has tried to follow-up with primary care as well as GI who she is established with.  The soonest they could get her in his primary care next week, and GI in 2 weeks.  Previously when she had been worked up with GI they had said that she had a significant case of GERD.  The emergency room also provided Phenergan for her nausea to the patient does not like to take as this makes her too sleepy throughout the day.  Patient is wondering if there is anything else she can take to help with nausea.    Allergies  Allergen Reactions  . Iodine Diarrhea, Itching and Hives    Tongue swells also Tongue swells also   . Doxycycline  Swelling  . Shellfish Allergy Other (See Comments)  . Sucralfate Other (See Comments)    Unknown  . Kinevac [Sincalide] Diarrhea  . Polyethylene Glycol Diarrhea  . Sulfa Antibiotics Other (See Comments)    Blisters on tongue.  . Sulfasalazine Itching  . Bentyl  [Dicyclomine Hcl] Itching and Rash  . Cephalexin Itching, Nausea And Vomiting and Rash  . Dicyclomine Other (See Comments) and Rash    Blisters on tongue.   . Ibuprofen Rash  . Iodinated Diagnostic Agents Rash and Swelling  . Metronidazole Rash  . Nitrofurantoin Itching and Rash  . Other Itching and Rash  . Ventolin [Albuterol] Nausea Only, Anxiety and Other (See Comments)    Dry mouth and dizziness    Immunization History  Administered Date(s) Administered  . Influenza, High Dose Seasonal PF 05/11/2016, 04/14/2017  . Pneumococcal Conjugate-13 06/24/2017  . Pneumococcal Polysaccharide-23 05/26/2016  . Tdap 11/20/2013    Past Medical History:  Diagnosis Date  . Coccyxdynia   . Depression   . Fatigue   . Hypothyroid   . Low back pain     Tobacco History: Social History   Tobacco Use  Smoking Status Former Smoker  . Packs/day: 1.50  . Years: 7.00  . Pack years: 10.50  . Types: Cigarettes  . Last attempt to quit:  11/27/1974  . Years since quitting: 43.0  Smokeless Tobacco Never Used   Counseling given: Yes Continue not using   Outpatient Encounter Medications as of 12/09/2017  Medication Sig  . acetaminophen (TYLENOL) 500 MG tablet Take 500 mg by mouth every 6 (six) hours as needed for mild pain or headache.  . budesonide-formoterol (SYMBICORT) 80-4.5 MCG/ACT inhaler Inhale 2 puffs into the lungs 2 (two) times daily.  . fluticasone (FLONASE) 50 MCG/ACT nasal spray One spray each nostril twice a day for nasal congestion or drainage.  . levalbuterol (XOPENEX HFA) 45 MCG/ACT inhaler 2 puffs with spacer every 6 hours if needed for cough or wheeze.  Marland Kitchen levothyroxine (SYNTHROID, LEVOTHROID) 88 MCG tablet Take 88  mcg by mouth daily.  Marland Kitchen LORazepam (ATIVAN) 0.5 MG tablet Take 0.5 mg by mouth every 8 (eight) hours.  . montelukast (SINGULAIR) 10 MG tablet Take 1 tablet once a day for coughing or wheezing  . Olopatadine HCl 0.2 % SOLN Place one drop into both eyes daily.  Marland Kitchen Dexlansoprazole (DEXILANT) 30 MG capsule Take 1 capsule (30 mg total) by mouth daily.  . famotidine (PEPCID) 20 MG tablet Take 1 tablet (20 mg total) by mouth 2 (two) times daily.  . ondansetron (ZOFRAN ODT) 4 MG disintegrating tablet Take 1 tablet (4 mg total) by mouth every 8 (eight) hours as needed for nausea or vomiting.   No facility-administered encounter medications on file as of 12/09/2017.      Review of Systems  Constitutional:  +fatigue      No  weight loss, night sweats,  fevers, chills HEENT:  +occasional HA  No   Difficulty swallowing,  Tooth/dental problems, or  Sore throat, No sneezing, itching, ear ache, nasal congestion, post nasal drip  CV: No chest pain,  orthopnea, PND, swelling in lower extremities, anasarca, dizziness, palpitations, syncope  GI:  +diarrhea / Nausea, yellow watery stools, abdominal pain / cramping     No heartburn, indigestion,  Resp:  + occasional dry cough, No shortness of breath with exertion or at rest.  No excess mucus, no productive cough,  No non-productive cough,  No coughing up of blood.  No change in color of mucus.  No wheezing.  No chest wall deformity Skin: no rash, lesions, no skin changes. GU: no dysuria, change in color of urine, no urgency or frequency.  No flank pain, no hematuria  MS:  No joint pain or swelling.  No decreased range of motion.  No back pain. Psych:  No change in mood or affect. No depression or anxiety.  No memory loss.   Physical Exam  BP 128/74   Pulse 73   Ht 5' 3"  (1.6 m)   Wt 167 lb (75.8 kg)   SpO2 99%   BMI 29.58 kg/m      Vitals - 1 value per visit 12/09/2017 11/25/2017 11/10/2017 09/06/2017  Weight (lb) 167 170 169.8 181.2    GEN: A/Ox3;  pleasant , NAD, well nourished   HEENT:  [place]/AT,  EACs-clear, TMs-wnl, NOSE-clear, THROAT-without ulcers, but continued brown build up on tongue   NECK:  Supple w/ fair ROM; no JVD; normal carotid impulses w/o bruits; no thyromegaly or nodules palpated; no lymphadenopathy.    RESP:  Clear  P & A; w/o, wheezes/ rales/ or rhonchi. no accessory muscle use, no dullness to percussion  CARD:  RRR, no m/r/g, no peripheral edema, pulses intact, no cyanosis or clubbing.  GI:   +hyperactive bowel sounds, slight R and L lower  quadrant pain with palpation  no organomegaly or masses detected.   Musco: Warm bil, no deformities or joint swelling noted.   Neuro: alert, no focal deficits noted.    Skin: Warm, no lesions or rashes    Lab Results:  CBC No results found for: WBC, RBC, HGB, HCT, PLT, MCV, MCH, MCHC, RDW, LYMPHSABS, MONOABS, EOSABS, BASOSABS  BMET    Component Value Date/Time   NA 138 12/09/2017 1030   K 3.7 12/09/2017 1030   CL 100 12/09/2017 1030   CO2 29 12/09/2017 1030   GLUCOSE 113 (H) 12/09/2017 1030   BUN 9 12/09/2017 1030   CREATININE 1.01 12/09/2017 1030   CALCIUM 9.6 12/09/2017 1030    BNP No results found for: BNP  ProBNP No results found for: PROBNP  Imaging: Dg Chest 2 View  Result Date: 11/10/2017 CLINICAL DATA:  Cough, shortness of breath for several months, exposure to gas fumes and dust, former smoking history EXAM: CHEST - 2 VIEW COMPARISON:  None. FINDINGS: No active infiltrate or effusion is seen. Mediastinal and hilar contours are unremarkable. The heart is within normal limits in size. There are degenerative changes in the mid to lower thoracic spine. Surgical clips are present in the right upper quadrant from prior cholecystectomy. IMPRESSION: No active cardiopulmonary disease. Electronically Signed   By: Ivar Drape M.D.   On: 11/10/2017 16:53     Assessment & Plan:   Pleasant 67 year old patient seen in office today.  Unfortunately I have no  answers for her continued stomach pains.  I believe she is having exacerbation of her GERD which we discussed extensively and I will treat her with Dexilant, Pepcid, and aggressively treat her diet until she follows up with GI and primary care to discuss these issues.  We will also prescribe her some Zofran for her nausea.  If these are to be continued and needs to be picked up by the primary care GI to manage.    Discussed extensively with patient that if she starts to have any blood in her stool, severe left lower quadrant pain, or other severe abdominal pain that does not resolve to present to emergency room.  I sympathize with the patient that this symptoms continue to be exacerbated by being present in her house and for how long she has been dealing with these issues.  I have encouraged her to continue the practice of trying to avoid sleeping there and the most that she can.  As her symptoms all seem to be exacerbated by being and that location.  Will also repeat CBC with differential Cmet today in office.  Patient to continue to attempt to follow-up with primary care as well as GI to see if there is any cancellations to get in sooner.  Patient will keep the scheduled appointments to discuss these issues with them.  Okay to stop Symbicort based off of last month pulmonary function test.  And patient not noticing a response.  Follow up with Dr. Chase Caller in 3 months.  Gastroesophageal reflux disease without esophagitis  Lab work today >>> CBC with diff, C-Met  Will order Dexilant 30 mg >>> Take 1 tablet on empty stomach in the morning wait 1 hour to eat breakfast or take other medications  Pepcid 20 mg >>> Take 1 tablet at night  Zofran 4 mg >>> Use as needed for nausea   Nausea  Zofran 4 mg >>> Use as needed for nausea   Reactive airway disease Okay to hold Symbicort at  this time  We will follow-up in 3 months to see Dr. Chase Caller  If breathing worsens or you have concerns  with your respiratory status please follow-up with [place].     Lauraine Rinne, NP 12/09/2017

## 2017-12-09 ENCOUNTER — Telehealth: Payer: Self-pay

## 2017-12-09 ENCOUNTER — Other Ambulatory Visit (INDEPENDENT_AMBULATORY_CARE_PROVIDER_SITE_OTHER): Payer: Medicare HMO

## 2017-12-09 ENCOUNTER — Ambulatory Visit: Payer: Medicare HMO | Admitting: Pulmonary Disease

## 2017-12-09 ENCOUNTER — Encounter: Payer: Self-pay | Admitting: Pulmonary Disease

## 2017-12-09 ENCOUNTER — Ambulatory Visit
Admission: RE | Admit: 2017-12-09 | Discharge: 2017-12-09 | Disposition: A | Discharge status: Home / Self Care | Payer: Self-pay | Source: Ambulatory Visit | Attending: Pulmonary Disease | Admitting: Pulmonary Disease

## 2017-12-09 VITALS — BP 128/74 | HR 73 | Ht 63.0 in | Wt 167.0 lb

## 2017-12-09 DIAGNOSIS — J4530 Mild persistent asthma, uncomplicated: Secondary | ICD-10-CM | POA: Diagnosis not present

## 2017-12-09 DIAGNOSIS — R110 Nausea: Secondary | ICD-10-CM

## 2017-12-09 DIAGNOSIS — K219 Gastro-esophageal reflux disease without esophagitis: Secondary | ICD-10-CM | POA: Diagnosis not present

## 2017-12-09 LAB — COMPREHENSIVE METABOLIC PANEL
ALBUMIN: 4.2 g/dL (ref 3.5–5.2)
ALK PHOS: 60 U/L (ref 39–117)
ALT: 11 U/L (ref 0–35)
AST: 12 U/L (ref 0–37)
BUN: 9 mg/dL (ref 6–23)
CO2: 29 mEq/L (ref 19–32)
Calcium: 9.6 mg/dL (ref 8.4–10.5)
Chloride: 100 mEq/L (ref 96–112)
Creatinine, Ser: 1.01 mg/dL (ref 0.40–1.20)
GFR: 57.78 mL/min — AB (ref >60.00)
GLUCOSE: 113 mg/dL — AB (ref 70–99)
POTASSIUM: 3.7 meq/L (ref 3.5–5.1)
Sodium: 138 mEq/L (ref 135–145)
TOTAL PROTEIN: 7 g/dL (ref 6.0–8.3)
Total Bilirubin: 0.5 mg/dL (ref 0.2–1.2)

## 2017-12-09 LAB — CBC WITH DIFFERENTIAL/PLATELET
BASOS ABS: 0 10*3/uL (ref 0.0–0.1)
Basophils Relative: 0.4 % (ref 0.0–3.0)
EOS PCT: 0.4 % (ref 0.0–5.0)
Eosinophils Absolute: 0 10*3/uL (ref 0.0–0.7)
HEMATOCRIT: 43.8 % (ref 36.0–46.0)
HEMOGLOBIN: 15 g/dL (ref 12.0–15.0)
LYMPHS PCT: 15.2 % (ref 12.0–46.0)
Lymphs Abs: 1.2 10*3/uL (ref 0.7–4.0)
MCHC: 34.2 g/dL (ref 30.0–36.0)
MCV: 94.1 fl (ref 78.0–100.0)
MONOS PCT: 9.3 % (ref 3.0–12.0)
Monocytes Absolute: 0.7 10*3/uL (ref 0.1–1.0)
NEUTROS PCT: 74.7 % (ref 43.0–77.0)
Neutro Abs: 5.9 10*3/uL (ref 1.4–7.7)
Platelets: 286 10*3/uL (ref 150.0–400.0)
RBC: 4.65 Mil/uL (ref 3.87–5.11)
RDW: 12.8 % (ref 11.5–15.5)
WBC: 7.9 10*3/uL (ref 4.0–10.5)

## 2017-12-09 MED ORDER — DEXLANSOPRAZOLE 30 MG PO CPDR: 30.0000 mg | DELAYED_RELEASE_CAPSULE | Freq: Every day | ORAL | 0 refills | Status: DC

## 2017-12-09 MED ORDER — FAMOTIDINE 20 MG PO TABS: 20.0000 mg | ORAL_TABLET | Freq: Two times a day (BID) | ORAL | 0 refills | Status: AC

## 2017-12-09 MED ORDER — ONDANSETRON 4 MG PO TBDP: 4.0000 mg | ORAL_TABLET | Freq: Three times a day (TID) | ORAL | 0 refills | Status: AC | PRN

## 2017-12-09 NOTE — Patient Instructions (Addendum)
Okay to stop Symbicort as you did not notice any improvement with the trial. >>> Normal pulmonary function test with slight mid flow reversibility trialed on Symbicort 80 after last appointment.  Lab work today >>> CBC with diff, C-Met  Will order Dexilant 30 mg >>> Take 1 tablet on empty stomach in the morning wait 1 hour to eat breakfast or take other medications  Pepcid 20 mg >>> Take 1 tablet at night  Zofran 4 mg >>> Use as needed for nausea   Follow-up with primary care next week as scheduled.  Follow-up with GI as scheduled later on this month.  If symptoms worsen please contact GI as well as primary care as well as present to emergency room.   Follow-up with Dr. Chase Caller in 3 months.   Please contact the office if your symptoms worsen or you have concerns that you are not improving.   Thank you for choosing [facility] for your healthcare, and for allowing [place] to partner with you on your healthcare journey. I am thankful to be able to provide care to you today.   Wyn Quaker FNP-C      Gastroesophageal Reflux Disease, Adult Normally, food travels down the esophagus and stays in the stomach to be digested. If a person has gastroesophageal reflux disease (GERD), food and stomach acid move back up into the esophagus. When this happens, the esophagus becomes sore and swollen (inflamed). Over time, GERD can make small holes (ulcers) in the lining of the esophagus. Follow these instructions at home: Diet  Follow a diet as told by your doctor. You may need to avoid foods and drinks such as: ? Coffee and tea (with or without caffeine). ? Drinks that contain alcohol. ? Energy drinks and sports drinks. ? Carbonated drinks or sodas. ? Chocolate and cocoa. ? Peppermint and mint flavorings. ? Garlic and onions. ? Horseradish. ? Spicy and acidic foods, such as peppers, chili powder, curry powder, vinegar, hot sauces, and BBQ sauce. ? Citrus fruit juices and citrus  fruits, such as oranges, lemons, and limes. ? Tomato-based foods, such as red sauce, chili, salsa, and pizza with red sauce. ? Fried and fatty foods, such as donuts, french fries, potato chips, and high-fat dressings. ? High-fat meats, such as hot dogs, rib eye steak, sausage, ham, and bacon. ? High-fat dairy items, such as whole milk, butter, and cream cheese.  Eat small meals often. Avoid eating large meals.  Avoid drinking large amounts of liquid with your meals.  Avoid eating meals during the 2-3 hours before bedtime.  Avoid lying down right after you eat.  Do not exercise right after you eat. General instructions  Pay attention to any changes in your symptoms.  Take over-the-counter and prescription medicines only as told by your doctor. Do not take aspirin, ibuprofen, or other NSAIDs unless your doctor says it is okay.  Do not use any tobacco products, including cigarettes, chewing tobacco, and e-cigarettes. If you need help quitting, ask your doctor.  Wear loose clothes. Do not wear anything tight around your waist.  Raise (elevate) the head of your bed about 6 inches (15 cm).  Try to lower your stress. If you need help doing this, ask your doctor.  If you are overweight, lose an amount of weight that is healthy for you. Ask your doctor about a safe weight loss goal.  Keep all follow-up visits as told by your doctor. This is important. Contact a doctor if:  You have new symptoms.  You lose weight and you do not know why it is happening.  You have trouble swallowing, or it hurts to swallow.  You have wheezing or a cough that keeps happening.  Your symptoms do not get better with treatment.  You have a hoarse voice. Get help right away if:  You have pain in your arms, neck, jaw, teeth, or back.  You feel sweaty, dizzy, or light-headed.  You have chest pain or shortness of breath.  You throw up (vomit) and your throw up looks like blood or coffee  grounds.  You pass out (faint).  Your poop (stool) is bloody or black.  You cannot swallow, drink, or eat. This information is not intended to replace advice given to you by your health care provider. Make sure you discuss any questions you have with your health care provider. Document Released: 12/01/2007 Document Revised: 11/20/2015 Document Reviewed: 10/09/2014 Elsevier Interactive Patient Education  2018 [place] for Gastroesophageal Reflux Disease, Adult When you have gastroesophageal reflux disease (GERD), the foods you eat and your eating habits are very important. Choosing the right foods can help ease your discomfort. What guidelines do I need to follow?  Choose fruits, vegetables, whole grains, and low-fat dairy products.  Choose low-fat meat, fish, and poultry.  Limit fats such as oils, salad dressings, butter, nuts, and avocado.  Keep a food diary. This helps you identify foods that cause symptoms.  Avoid foods that cause symptoms. These may be different for everyone.  Eat small meals often instead of 3 large meals a day.  Eat your meals slowly, in a place where you are relaxed.  Limit fried foods.  Cook foods using methods other than frying.  Avoid drinking alcohol.  Avoid drinking large amounts of liquids with your meals.  Avoid bending over or lying down until 2-3 hours after eating. What foods are not recommended? These are some foods and drinks that may make your symptoms worse: Vegetables Tomatoes. Tomato juice. Tomato and spaghetti sauce. Chili peppers. Onion and garlic. Horseradish. Fruits Oranges, grapefruit, and lemon (fruit and juice). Meats High-fat meats, fish, and poultry. This includes hot dogs, ribs, ham, sausage, salami, and bacon. Dairy Whole milk and chocolate milk. Sour cream. Cream. Butter. Ice cream. Cream cheese. Drinks Coffee and tea. Bubbly (carbonated) drinks or energy drinks. Condiments Hot sauce.  Barbecue sauce. Sweets/Desserts Chocolate and cocoa. Donuts. Peppermint and spearmint. Fats and Oils High-fat foods. This includes [place] fries and potato chips. Other Vinegar. Strong spices. This includes black pepper, white pepper, red pepper, cayenne, curry powder, cloves, ginger, and chili powder. The items listed above may not be a complete list of foods and drinks to avoid. Contact your dietitian for more information. This information is not intended to replace advice given to you by your health care provider. Make sure you discuss any questions you have with your health care provider. Document Released: 12/14/2011 Document Revised: 11/20/2015 Document Reviewed: 04/18/2013 Elsevier Interactive Patient Education  2017 [employer].

## 2017-12-09 NOTE — Assessment & Plan Note (Signed)
  Zofran 4 mg >>> Use as needed for nausea

## 2017-12-09 NOTE — Telephone Encounter (Signed)
Called and spoke with patient regarding lab results per NP B Mack. Labs routed to PCP. No further questions or concerns at this time.

## 2017-12-09 NOTE — Progress Notes (Signed)
Your lab results have come back.  They are all normal with the exception of a slightly reduced GFR.  This is a marker for your kidney functioning.  It is slightly reduced to 57.78.  I would recommend repeating these lab test results when you see primary care next week.  No further changes in your plan of care at this time.   Elisha HeadlandBrian Mack FNP

## 2017-12-09 NOTE — Assessment & Plan Note (Signed)
Okay to hold Symbicort at this time  We will follow-up in 3 months to see Dr. Marchelle Gearingamaswamy  If breathing worsens or you have concerns with your respiratory status please follow-up with us.

## 2017-12-09 NOTE — Assessment & Plan Note (Signed)
  Lab work today >>> CBC with diff, C-Met  Will order Dexilant 30 mg >>> Take 1 tablet on empty stomach in the morning wait 1 hour to eat breakfast or take other medications  Pepcid 20 mg >>> Take 1 tablet at night  Zofran 4 mg >>> Use as needed for nausea

## 2017-12-12 ENCOUNTER — Telehealth: Payer: Self-pay | Admitting: Pulmonary Disease

## 2017-12-12 NOTE — Telephone Encounter (Signed)
Notes recorded by Coral CeoMack, Brian P, NP on 12/09/2017 at 12:18 PM EDT Your lab results have come back. They are all normal with the exception of a slightly reduced GFR. This is a marker for your kidney functioning. It is slightly reduced to 57.78. I would recommend repeating these lab test results when you see primary care next week. No further changes in your plan of care at this time.   Elisha HeadlandBrian Mack FNP ---------------------------------------- Attempted to call pt. I did not receive an answer. I have left a message for pt to return our call.

## 2017-12-12 NOTE — Telephone Encounter (Signed)
Spoke with pt. She is aware of results. Pt requests that these get sent to his PCP and her Hepatologist  Nothing further was needed.

## 2017-12-22 ENCOUNTER — Ambulatory Visit: Payer: Medicare HMO | Admitting: Internal Medicine

## 2017-12-23 ENCOUNTER — Ambulatory Visit: Payer: Medicare HMO | Admitting: Internal Medicine

## 2017-12-31 ENCOUNTER — Other Ambulatory Visit: Payer: Self-pay | Admitting: Pulmonary Disease

## 2017-12-31 DIAGNOSIS — K219 Gastro-esophageal reflux disease without esophagitis: Secondary | ICD-10-CM

## 2018-01-10 ENCOUNTER — Other Ambulatory Visit: Payer: Self-pay | Admitting: Pulmonary Disease

## 2018-01-10 MED ORDER — DEXLANSOPRAZOLE 30 MG PO CPDR: 30.0000 mg | DELAYED_RELEASE_CAPSULE | Freq: Every day | ORAL | 0 refills | Status: AC

## 2018-03-02 ENCOUNTER — Other Ambulatory Visit: Payer: Self-pay | Admitting: Pediatrics

## 2018-03-13 ENCOUNTER — Ambulatory Visit: Payer: Medicare HMO | Admitting: Internal Medicine

## 2018-11-07 IMAGING — CT CT OUTSIDE FILMS CHEST
3 of 6 series · 14 of 32 positions shown, 16 images · non-contrast
Comparison: none

[Series 3: lung 1.25mm · axial · 0.70mm/px · z∈[-194,+4]mm · 6 of 238 slices shown, 8 images]
[im 40/238  mediastinal]
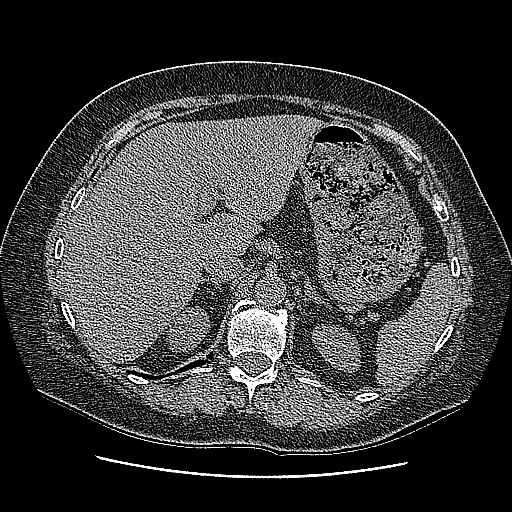
[im 40/238  lung]
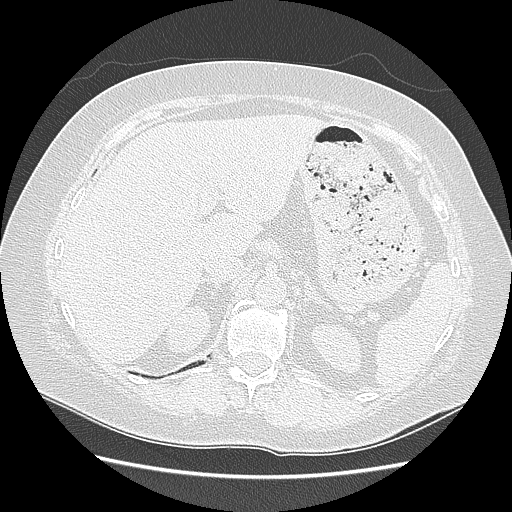
[im 80/238  lung]
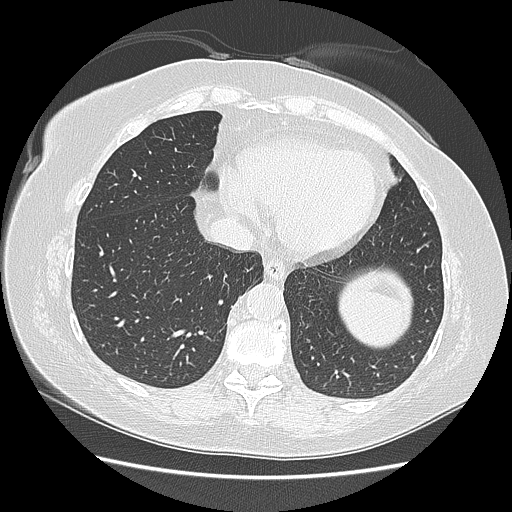
[im 110/238  lung]
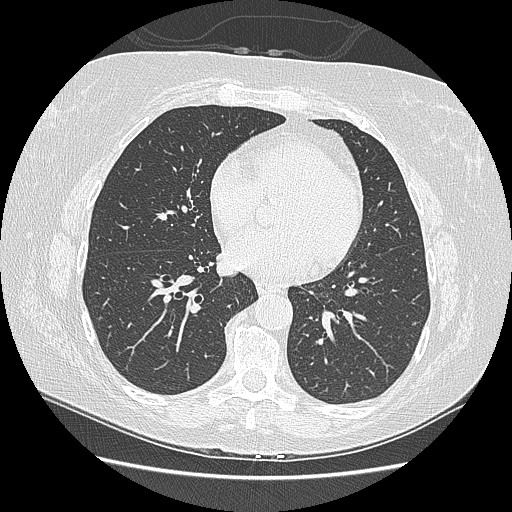
[im 119/238  lung]
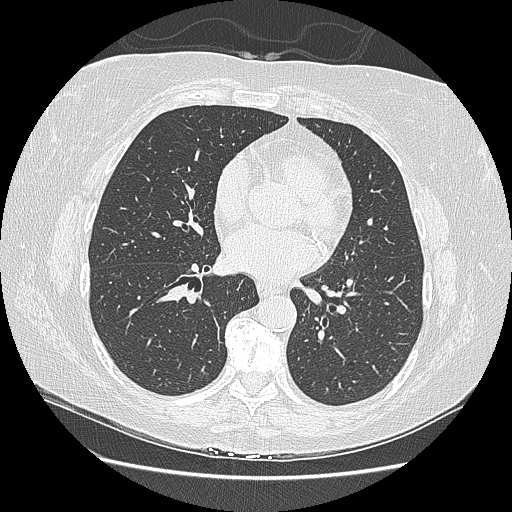
[im 159/238  mediastinal]
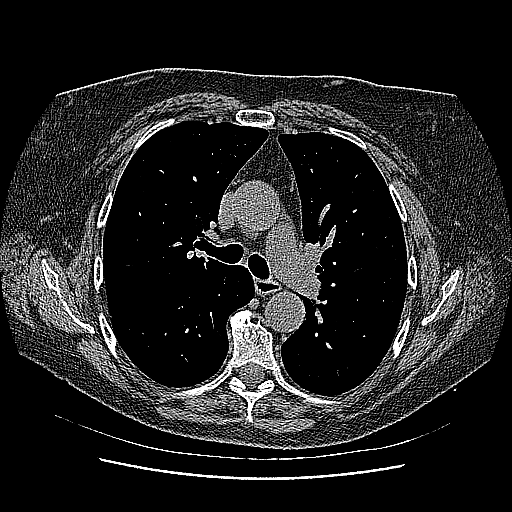
[im 159/238  lung]
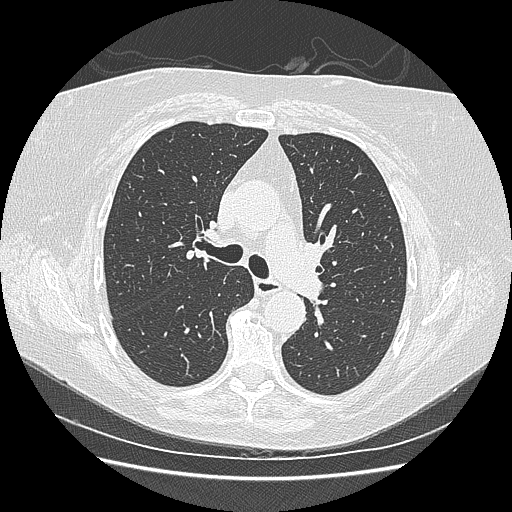
[im 198/238  lung]
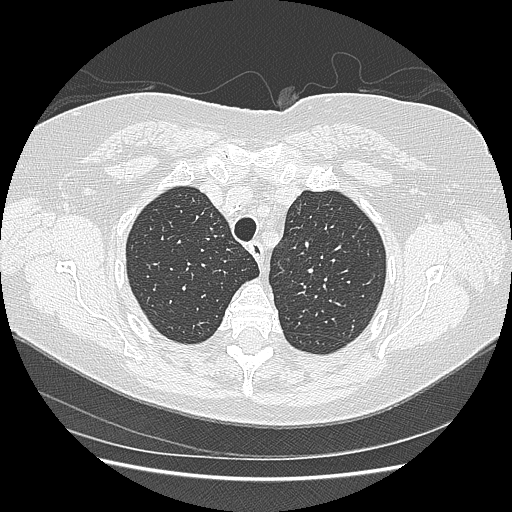

[Series 14: prone lung · axial · 0.74mm/px · z∈[-223,-62]mm · 5 of 216 slices shown]
[im 44/216  lung]
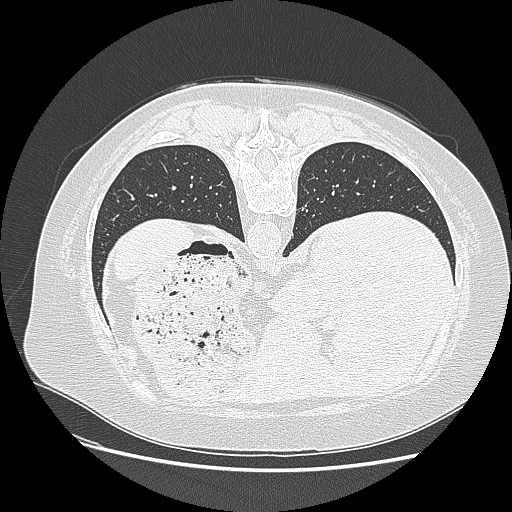
[im 87/216  lung]
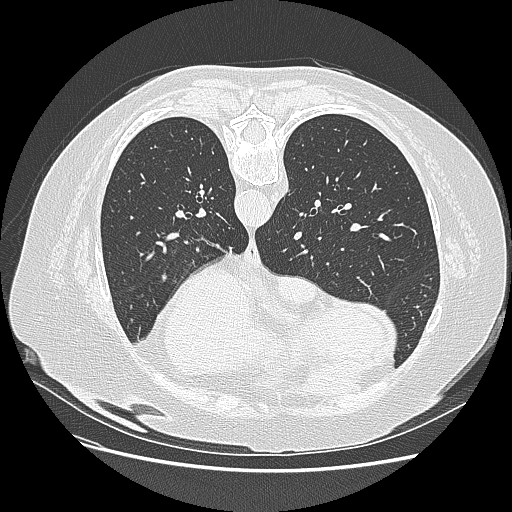
[im 130/216  lung]
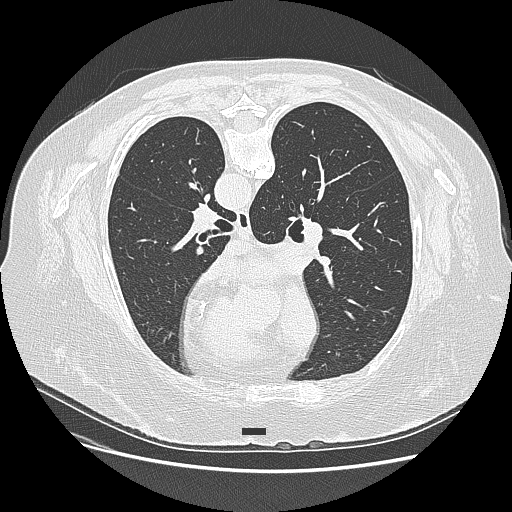
[im 138/216  lung]
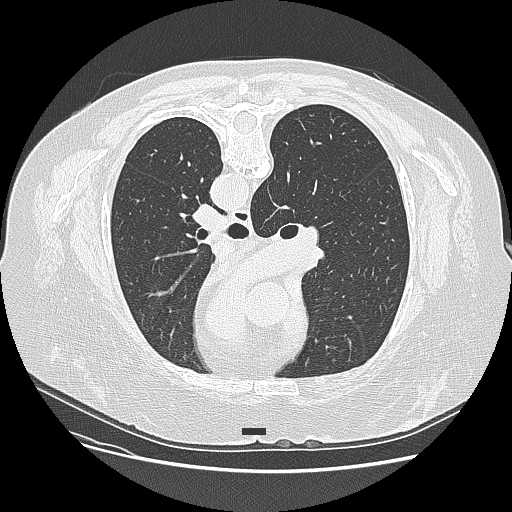
[im 173/216  lung]
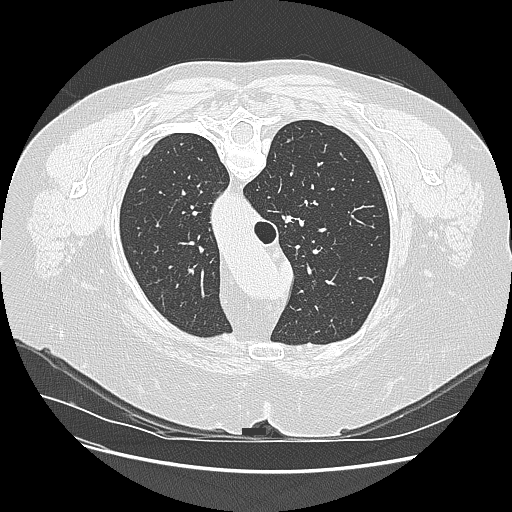

[Series 601: 2mm sag · sagittal · 0.70mm/px · 3 of 180 slices shown]
[im 45/180  lung]
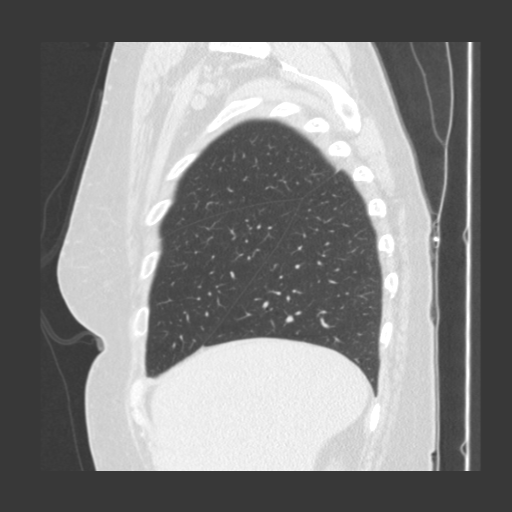
[im 90/180  lung]
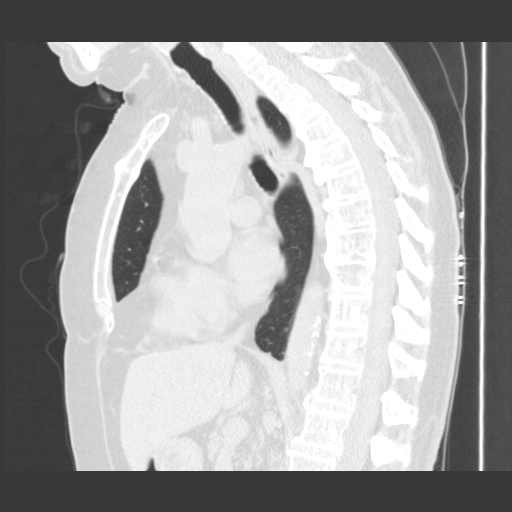
[im 135/180  lung]
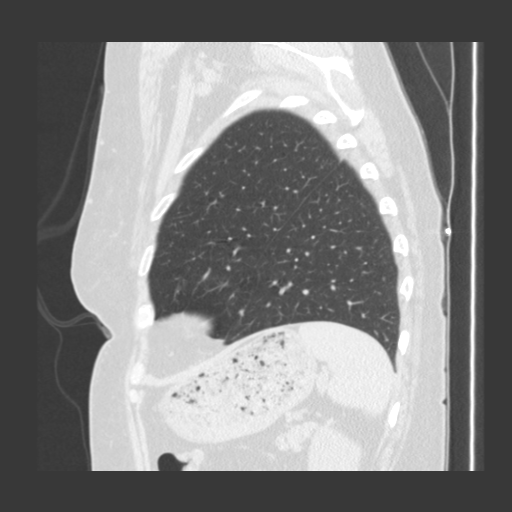

[14 of 32 positions shown; findings below may reference images not displayed]

Canned report from images found in remote index.

Refer to host system for actual result text.
